# Patient Record
Sex: Female | Born: 2000 | Race: White | Hispanic: No | Marital: Single | Smoking: Never smoker
Health system: Southern US, Community
[De-identification: ages and names within clinical notes are randomized; demographics above are authoritative.]

## PROBLEM LIST (undated history)

## (undated) DIAGNOSIS — M419 Scoliosis, unspecified: Secondary | ICD-10-CM

## (undated) HISTORY — PX: NO PAST SURGERIES: SHX2092

---
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
## 2019-02-14 NOTE — L&D Delivery Note (Signed)
OB/GYN Faculty Practice Delivery Note  Cynthia Osborne is a 19 y.o. G1P0 s/p vaginal delivery at [redacted]w[redacted]d. She was admitted for PROM.   ROM: 20h 73m with clear fluid GBS Status: negative Maximum Maternal Temperature: 99.80F  Labor Progress: On arrival to L&D, pt was noted to be 1/70/-2. After conversation with pt, FB and cytotec were placed. Pt then progressed to complete and delivered without complication.  Delivery Date/Time: [phone] on 12/20/19 Delivery: Called to room and patient was complete and pushing. Head delivered ROA. No nuchal cord present. Shoulder and body delivered in usual fashion. Infant with spontaneous cry, placed on mother's abdomen, dried and stimulated. Cord clamped x 2 after 1-minute delay, and cut by FOB under my direct supervision. Cord blood drawn. Placenta delivered spontaneously with gentle cord traction. Fundus firm with massage and Pitocin. Labia, perineum, vagina, and cervix were inspected, notable for 2nd degree perineal laceration. Liletta IUD was placed and then perineal laceration was repaired as noted below.  Placenta: 3-vessel cord, intact, sent to L&D Complications: none Lacerations: 2nd degree perineal laceration repaired in standard fashion with use of 3-0 vicryl EBL: 502 ml Analgesia: epidural  Infant: female  APGARs 9 & 9  3691g  Post-Placental IUD Insertion Procedure Note Patient identified, informed consent signed prior to delivery, signed copy in chart, time out was performed.    Vaginal, labial and perineal areas thoroughly inspected for lacerations. Second degree laceration identified - not hemostatic, not repaired prior to insertion of Liletta IUD.   IUD grasped between sterile gloved fingers. Sterile lubrication applied to sterile gloved hand for ease of insertion. Fundus identified through abdominal wall using non-insertion hand. IUD inserted to fundus with bimanual technique. IUD carefully released at the fundus and insertion hand gently  removed from vagina.   Strings trimmed to the level of the introitus. Patient tolerated procedure well.  Patient given post procedure instructions and IUD care card with expiration date.  Patient is asked to keep IUD strings tucked in her vagina until her postpartum follow up visit in 4-6 weeks. Patient advised to abstain from sexual intercourse and pulling on strings before her follow-up visit. Patient verbalized an understanding of the plan of care and agrees.   Sheila Oats, MD OB Fellow, Faculty Practice 12/20/2019 10:31 AM

## 2019-05-12 LAB — HEPATITIS C ANTIBODY: HCV Ab: NEGATIVE

## 2019-05-12 LAB — OB RESULTS CONSOLE RUBELLA ANTIBODY, IGM: Rubella: IMMUNE

## 2019-05-12 LAB — OB RESULTS CONSOLE HEPATITIS B SURFACE ANTIGEN: Hepatitis B Surface Ag: NEGATIVE

## 2019-06-16 DIAGNOSIS — M5432 Sciatica, left side: Secondary | ICD-10-CM | POA: Insufficient documentation

## 2019-12-01 LAB — OB RESULTS CONSOLE RPR: RPR: NONREACTIVE

## 2019-12-01 LAB — OB RESULTS CONSOLE HIV ANTIBODY (ROUTINE TESTING): HIV: NONREACTIVE

## 2019-12-10 ENCOUNTER — Other Ambulatory Visit: Payer: Self-pay

## 2019-12-10 ENCOUNTER — Inpatient Hospital Stay (HOSPITAL_COMMUNITY)
Admission: AD | Admit: 2019-12-10 | Discharge: 2019-12-11 | Disposition: A | Discharge status: Home / Self Care | Payer: Medicaid Other | Source: Home / Self Care | Attending: Obstetrics & Gynecology | Admitting: Obstetrics & Gynecology

## 2019-12-10 ENCOUNTER — Encounter (HOSPITAL_COMMUNITY): Payer: Self-pay | Admitting: Obstetrics & Gynecology

## 2019-12-10 ENCOUNTER — Inpatient Hospital Stay (HOSPITAL_COMMUNITY)
Admission: AD | Admit: 2019-12-10 | Discharge: 2019-12-10 | Disposition: A | Discharge status: Home / Self Care | Payer: Medicaid Other | Attending: Obstetrics & Gynecology | Admitting: Obstetrics & Gynecology

## 2019-12-10 DIAGNOSIS — O479 False labor, unspecified: Secondary | ICD-10-CM

## 2019-12-10 DIAGNOSIS — O471 False labor at or after 37 completed weeks of gestation: Secondary | ICD-10-CM | POA: Insufficient documentation

## 2019-12-10 DIAGNOSIS — Z3A37 37 weeks gestation of pregnancy: Secondary | ICD-10-CM

## 2019-12-10 HISTORY — DX: Scoliosis, unspecified: M41.9

## 2019-12-10 NOTE — Discharge Instructions (Signed)
Braxton Hicks Contractions °Contractions of the uterus can occur throughout pregnancy, but they are not always a sign that you are in labor. You may have practice contractions called Braxton Hicks contractions. These false labor contractions are sometimes confused with true labor. °What are Braxton Hicks contractions? °Braxton Hicks contractions are tightening movements that occur in the muscles of the uterus before labor. Unlike true labor contractions, these contractions do not result in opening (dilation) and thinning of the cervix. Toward the end of pregnancy (32-34 weeks), Braxton Hicks contractions can happen more often and may become stronger. These contractions are sometimes difficult to tell apart from true labor because they can be very uncomfortable. You should not feel embarrassed if you go to the hospital with false labor. °Sometimes, the only way to tell if you are in true labor is for your health care provider to look for changes in the cervix. The health care provider will do a physical exam and may monitor your contractions. If you are not in true labor, the exam should show that your cervix is not dilating and your water has not broken. °If there are no other health problems associated with your pregnancy, it is completely safe for you to be sent home with false labor. You may continue to have Braxton Hicks contractions until you go into true labor. °How to tell the difference between true labor and false labor °True labor °· Contractions last 30-70 seconds. °· Contractions become very regular. °· Discomfort is usually felt in the top of the uterus, and it spreads to the lower abdomen and low back. °· Contractions do not go away with walking. °· Contractions usually become more intense and increase in frequency. °· The cervix dilates and gets thinner. °False labor °· Contractions are usually shorter and not as strong as true labor contractions. °· Contractions are usually irregular. °· Contractions  are often felt in the front of the lower abdomen and in the groin. °· Contractions may go away when you walk around or change positions while lying down. °· Contractions get weaker and are shorter-lasting as time goes on. °· The cervix usually does not dilate or become thin. °Follow these instructions at home: ° °· Take over-the-counter and prescription medicines only as told by your health care provider. °· Keep up with your usual exercises and follow other instructions from your health care provider. °· Eat and drink lightly if you think you are going into labor. °· If Braxton Hicks contractions are making you uncomfortable: °? Change your position from lying down or resting to walking, or change from walking to resting. °? Sit and rest in a tub of warm water. °? Drink enough fluid to keep your urine pale yellow. Dehydration may cause these contractions. °? Do slow and deep breathing several times an hour. °· Keep all follow-up prenatal visits as told by your health care provider. This is important. °Contact a health care provider if: °· You have a fever. °· You have continuous pain in your abdomen. °Get help right away if: °· Your contractions become stronger, more regular, and closer together. °· You have fluid leaking or gushing from your vagina. °· You pass blood-tinged mucus (bloody show). °· You have bleeding from your vagina. °· You have low back pain that you never had before. °· You feel your baby’s head pushing down and causing pelvic pressure. °· Your baby is not moving inside you as much as it used to. °Summary °· Contractions that occur before labor are   called Braxton Hicks contractions, false labor, or practice contractions. °· Braxton Hicks contractions are usually shorter, weaker, farther apart, and less regular than true labor contractions. True labor contractions usually become progressively stronger and regular, and they become more frequent. °· Manage discomfort from Braxton Hicks contractions  by changing position, resting in a warm bath, drinking plenty of water, or practicing deep breathing. °This information is not intended to replace advice given to you by your health care provider. Make sure you discuss any questions you have with your health care provider. °Document Revised: 01/12/2017 Document Reviewed: 06/15/2016 °Elsevier Patient Education © 2020 [employer] ° °

## 2019-12-10 NOTE — MAU Note (Signed)
Ctx started about 6 am. 4-5 min aprt now. Denies any vag bleeding or leaking at this time. Good fetal movement felt. Goes to [facility] for prenatal care  But will prefer to deliver at [facility]. Fingertip in office this week.

## 2019-12-10 NOTE — MAU Provider Note (Addendum)
S: Ms. Cynthia Osborne is a 19 y.o. G1P0 at [redacted]w[redacted]d  who presents to MAU today for labor evaluation.     Cervical exam by RN:  Dilation: 1 Effacement (%): 50, 60 Cervical Position: Middle Station: -3 Presentation: Vertex Exam by:: n druebbisch rn  Fetal Monitoring: Baseline: 140 Variability: mod Accelerations: yes Decelerations: no Contractions: irregular  MDM Discussed patient with RN. NST reviewed and reactive  A: SIUP at [redacted]w[redacted]d  False labor  P: Discharge home Labor precautions and kick counts included in AVS Patient to follow-up with [facility] as scheduled  Patient may return to MAU as needed or when in labor   Jesusita Oka, MD 12/10/2019 10:22 AM

## 2019-12-10 NOTE — Progress Notes (Signed)
Cynthia Osborne CNM aware of pt's admission and status. Will reck in an hour. Pt aware and agrees with POC

## 2019-12-10 NOTE — Progress Notes (Signed)
OK per Artelia Laroche CNM to d/c EFM per pt request so she can ambulate and use ball.

## 2019-12-10 NOTE — MAU Note (Addendum)
Ctxs since 0400. Was seen earlier today in MAU with ctxs. 1cm at that ck. Ctxs closer and stronger and feeling pelvic pressure. Denies LOF or VB. Pt gets PNC in August but plans to deliver here.

## 2019-12-11 DIAGNOSIS — O471 False labor at or after 37 completed weeks of gestation: Secondary | ICD-10-CM | POA: Diagnosis not present

## 2019-12-11 MED ORDER — ACETAMINOPHEN 500 MG PO TABS
1000.0000 mg | ORAL_TABLET | Freq: Once | ORAL | Status: AC
  Administered 2019-12-11: 1000 mg via ORAL
  Filled 2019-12-11: qty 2

## 2019-12-11 NOTE — Progress Notes (Addendum)
WRitten and verbal d/c instructions given and understanding voiced. Discussed with pt and her mother that pt could try warm baths, position changes, Benadryl and Tylenol may help her rest. Do not take anymore Tylenol tonight since she had it in MAU but could take Benadryl when she gets home.

## 2019-12-11 NOTE — Progress Notes (Signed)
Dr Larita Fife called and report given and update that pt's h/a is better. Aware of sve x 3, FHR reactive, ctx pattern. PT stable for d/c home.

## 2019-12-11 NOTE — Progress Notes (Signed)
PT states she felt something bulging when she was walking and had a lot of pelvic pressure. SVE done and no change.

## 2019-12-11 NOTE — Discharge Instructions (Signed)
Braxton Hicks Contractions °Contractions of the uterus can occur throughout pregnancy, but they are not always a sign that you are in labor. You may have practice contractions called Braxton Hicks contractions. These false labor contractions are sometimes confused with true labor. °What are Braxton Hicks contractions? °Braxton Hicks contractions are tightening movements that occur in the muscles of the uterus before labor. Unlike true labor contractions, these contractions do not result in opening (dilation) and thinning of the cervix. Toward the end of pregnancy (32-34 weeks), Braxton Hicks contractions can happen more often and may become stronger. These contractions are sometimes difficult to tell apart from true labor because they can be very uncomfortable. You should not feel embarrassed if you go to the hospital with false labor. °Sometimes, the only way to tell if you are in true labor is for your health care provider to look for changes in the cervix. The health care provider will do a physical exam and may monitor your contractions. If you are not in true labor, the exam should show that your cervix is not dilating and your water has not broken. °If there are no other health problems associated with your pregnancy, it is completely safe for you to be sent home with false labor. You may continue to have Braxton Hicks contractions until you go into true labor. °How to tell the difference between true labor and false labor °True labor °· Contractions last 30-70 seconds. °· Contractions become very regular. °· Discomfort is usually felt in the top of the uterus, and it spreads to the lower abdomen and low back. °· Contractions do not go away with walking. °· Contractions usually become more intense and increase in frequency. °· The cervix dilates and gets thinner. °False labor °· Contractions are usually shorter and not as strong as true labor contractions. °· Contractions are usually irregular. °· Contractions  are often felt in the front of the lower abdomen and in the groin. °· Contractions may go away when you walk around or change positions while lying down. °· Contractions get weaker and are shorter-lasting as time goes on. °· The cervix usually does not dilate or become thin. °Follow these instructions at home: ° °· Take over-the-counter and prescription medicines only as told by your health care provider. °· Keep up with your usual exercises and follow other instructions from your health care provider. °· Eat and drink lightly if you think you are going into labor. °· If Braxton Hicks contractions are making you uncomfortable: °? Change your position from lying down or resting to walking, or change from walking to resting. °? Sit and rest in a tub of warm water. °? Drink enough fluid to keep your urine pale yellow. Dehydration may cause these contractions. °? Do slow and deep breathing several times an hour. °· Keep all follow-up prenatal visits as told by your health care provider. This is important. °Contact a health care provider if: °· You have a fever. °· You have continuous pain in your abdomen. °Get help right away if: °· Your contractions become stronger, more regular, and closer together. °· You have fluid leaking or gushing from your vagina. °· You pass blood-tinged mucus (bloody show). °· You have bleeding from your vagina. °· You have low back pain that you never had before. °· You feel your baby’s head pushing down and causing pelvic pressure. °· Your baby is not moving inside you as much as it used to. °Summary °· Contractions that occur before labor are   called Braxton Hicks contractions, false labor, or practice contractions. °· Braxton Hicks contractions are usually shorter, weaker, farther apart, and less regular than true labor contractions. True labor contractions usually become progressively stronger and regular, and they become more frequent. °· Manage discomfort from Braxton Hicks contractions  by changing position, resting in a warm bath, drinking plenty of water, or practicing deep breathing. °This information is not intended to replace advice given to you by your health care provider. Make sure you discuss any questions you have with your health care provider. °Document Revised: 01/12/2017 Document Reviewed: 06/15/2016 °Elsevier Patient Education © 2020 [employer] ° °

## 2019-12-11 NOTE — Progress Notes (Signed)
Dr Larita Fife called in BS. Finishing a delivery and will call to MAU. Pt requested Tylenol for h/a. Verbal order given. Dr Larita Fife will call MAU when finished with delivery.

## 2019-12-19 ENCOUNTER — Other Ambulatory Visit: Payer: Self-pay

## 2019-12-19 ENCOUNTER — Encounter (HOSPITAL_COMMUNITY): Payer: Self-pay | Admitting: Obstetrics and Gynecology

## 2019-12-19 ENCOUNTER — Inpatient Hospital Stay (HOSPITAL_COMMUNITY)
Admission: AD | Admit: 2019-12-19 | Discharge: 2019-12-22 | DRG: 807 | Disposition: A | Discharge status: Home / Self Care | Payer: Medicaid Other | Attending: Obstetrics and Gynecology | Admitting: Obstetrics and Gynecology

## 2019-12-19 DIAGNOSIS — G8929 Other chronic pain: Secondary | ICD-10-CM

## 2019-12-19 DIAGNOSIS — O99893 Other specified diseases and conditions complicating puerperium: Secondary | ICD-10-CM | POA: Diagnosis not present

## 2019-12-19 DIAGNOSIS — O4292 Full-term premature rupture of membranes, unspecified as to length of time between rupture and onset of labor: Secondary | ICD-10-CM | POA: Diagnosis present

## 2019-12-19 DIAGNOSIS — O4202 Full-term premature rupture of membranes, onset of labor within 24 hours of rupture: Secondary | ICD-10-CM | POA: Diagnosis not present

## 2019-12-19 DIAGNOSIS — M4135 Thoracogenic scoliosis, thoracolumbar region: Secondary | ICD-10-CM | POA: Diagnosis not present

## 2019-12-19 DIAGNOSIS — Z3490 Encounter for supervision of normal pregnancy, unspecified, unspecified trimester: Secondary | ICD-10-CM

## 2019-12-19 DIAGNOSIS — Z3A38 38 weeks gestation of pregnancy: Secondary | ICD-10-CM

## 2019-12-19 DIAGNOSIS — E669 Obesity, unspecified: Secondary | ICD-10-CM | POA: Diagnosis present

## 2019-12-19 DIAGNOSIS — Z30014 Encounter for initial prescription of intrauterine contraceptive device: Secondary | ICD-10-CM | POA: Diagnosis not present

## 2019-12-19 DIAGNOSIS — Z30430 Encounter for insertion of intrauterine contraceptive device: Secondary | ICD-10-CM | POA: Diagnosis not present

## 2019-12-19 DIAGNOSIS — O99892 Other specified diseases and conditions complicating childbirth: Secondary | ICD-10-CM | POA: Diagnosis present

## 2019-12-19 DIAGNOSIS — R519 Headache, unspecified: Secondary | ICD-10-CM | POA: Diagnosis not present

## 2019-12-19 DIAGNOSIS — O99214 Obesity complicating childbirth: Secondary | ICD-10-CM | POA: Diagnosis present

## 2019-12-19 DIAGNOSIS — M419 Scoliosis, unspecified: Secondary | ICD-10-CM | POA: Diagnosis present

## 2019-12-19 DIAGNOSIS — Z20822 Contact with and (suspected) exposure to covid-19: Secondary | ICD-10-CM | POA: Diagnosis present

## 2019-12-19 DIAGNOSIS — Z975 Presence of (intrauterine) contraceptive device: Secondary | ICD-10-CM | POA: Diagnosis not present

## 2019-12-19 DIAGNOSIS — O26893 Other specified pregnancy related conditions, third trimester: Secondary | ICD-10-CM | POA: Diagnosis present

## 2019-12-19 LAB — CBC
HCT: 33.5 % — ABNORMAL LOW (ref 36.0–46.0)
Hemoglobin: 10.9 g/dL — ABNORMAL LOW (ref 12.0–15.0)
MCH: 28.7 pg (ref 26.0–34.0)
MCHC: 32.5 g/dL (ref 30.0–36.0)
MCV: 88.2 fL (ref 80.0–100.0)
Platelets: 175 10*3/uL (ref 150–400)
RBC: 3.8 MIL/uL — ABNORMAL LOW (ref 3.87–5.11)
RDW: 13.4 % (ref 11.5–15.5)
WBC: 9.8 10*3/uL (ref 4.0–10.5)
nRBC: 0 % (ref 0.0–0.2)

## 2019-12-19 LAB — RESPIRATORY PANEL BY RT PCR (FLU A&B, COVID)
Influenza A by PCR: NEGATIVE
Influenza B by PCR: NEGATIVE
SARS Coronavirus 2 by RT PCR: NEGATIVE

## 2019-12-19 LAB — TYPE AND SCREEN
ABO/RH(D): B POS
Antibody Screen: NEGATIVE

## 2019-12-19 LAB — POCT FERN TEST: POCT Fern Test: POSITIVE — AB

## 2019-12-19 MED ORDER — LACTATED RINGERS IV SOLN
500.0000 mL | INTRAVENOUS | Status: DC | PRN
  Administered 2019-12-20: 500 mL via INTRAVENOUS

## 2019-12-19 MED ORDER — FENTANYL CITRATE (PF) 100 MCG/2ML IJ SOLN
50.0000 ug | INTRAMUSCULAR | Status: DC | PRN
  Administered 2019-12-19 – 2019-12-20 (×3): 100 ug via INTRAVENOUS
  Filled 2019-12-19 (×3): qty 2

## 2019-12-19 MED ORDER — ACETAMINOPHEN 325 MG PO TABS: 650.0000 mg | ORAL_TABLET | ORAL | Status: DC | PRN

## 2019-12-19 MED ORDER — LACTATED RINGERS IV SOLN: INTRAVENOUS | Status: DC

## 2019-12-19 MED ORDER — FLEET ENEMA 7-19 GM/118ML RE ENEM: 1.0000 | ENEMA | RECTAL | Status: DC | PRN

## 2019-12-19 MED ORDER — OXYTOCIN BOLUS FROM INFUSION
333.0000 mL | Freq: Once | INTRAVENOUS | Status: AC
  Administered 2019-12-20: 333 mL via INTRAVENOUS

## 2019-12-19 MED ORDER — OXYTOCIN-SODIUM CHLORIDE 30-0.9 UT/500ML-% IV SOLN
2.5000 [IU]/h | INTRAVENOUS | Status: DC
  Administered 2019-12-20: 2.5 [IU]/h via INTRAVENOUS
  Filled 2019-12-19: qty 500

## 2019-12-19 MED ORDER — OXYCODONE-ACETAMINOPHEN 5-325 MG PO TABS: 2.0000 | ORAL_TABLET | ORAL | Status: DC | PRN

## 2019-12-19 MED ORDER — LIDOCAINE HCL (PF) 1 % IJ SOLN: 30.0000 mL | INTRAMUSCULAR | Status: DC | PRN

## 2019-12-19 MED ORDER — SOD CITRATE-CITRIC ACID 500-334 MG/5ML PO SOLN: 30.0000 mL | ORAL | Status: DC | PRN

## 2019-12-19 MED ORDER — ONDANSETRON HCL 4 MG/2ML IJ SOLN
4.0000 mg | Freq: Four times a day (QID) | INTRAMUSCULAR | Status: DC | PRN
  Administered 2019-12-19 – 2019-12-20 (×2): 4 mg via INTRAVENOUS
  Filled 2019-12-19 (×2): qty 2

## 2019-12-19 MED ORDER — MISOPROSTOL 50MCG HALF TABLET
50.0000 ug | ORAL_TABLET | Freq: Once | ORAL | Status: AC
  Administered 2019-12-19: 50 ug via ORAL
  Filled 2019-12-19: qty 1

## 2019-12-19 MED ORDER — OXYCODONE-ACETAMINOPHEN 5-325 MG PO TABS: 1.0000 | ORAL_TABLET | ORAL | Status: DC | PRN

## 2019-12-19 NOTE — H&P (Addendum)
OBSTETRIC ADMISSION HISTORY AND PHYSICAL  Cynthia Osborne is a 19 y.o. female G1P0 with IUP at [redacted]w[redacted]d by LMP presenting for ROM and contractions. She reports +FMs, No LOF, no VB, no blurry vision, headaches or peripheral edema, and RUQ pain.  She plans on breast feeding. She request IUD, mirena for birth control. She received her prenatal care at  [facility] in [place]  but plans to deliver here at [facility] because of recent move to [place].   Dating: By LMP --->  Estimated Date of Delivery: 12/27/19  Sono:  @ 20+w, female fetus, normal anatomy, anterior placenta  @36 +wks, CWD, normal anatomy, vertex presentation, 7 lb 15 oz, 90th% EFW  Prenatal History/Complications:  Scoliosis of thoracolumbar spine, unspecified scoliosis type   Chronic nonintractable headache N/V prior to [redacted] weeks gestation Sciatic pain, left-sided  Past Medical History: Past Medical History:  Diagnosis Date   Scoliosis     Past Surgical History: Past Surgical History:  Procedure Laterality Date   NO PAST SURGERIES      Obstetrical History: OB History     Gravida  1   Para      Term      Preterm      AB      Living         SAB      TAB      Ectopic      Multiple      Live Births              Social History Social History   Socioeconomic History   Marital status: Single    Spouse name: Not on file   Number of children: Not on file   Years of education: Not on file   Highest education level: Not on file  Occupational History   Not on file  Tobacco Use   Smoking status: Never Smoker   Smokeless tobacco: Never Used  Substance and Sexual Activity   Alcohol use: Never   Drug use: Never   Sexual activity: Not on file  Other Topics Concern   Not on file  Social History Narrative   Not on file   Social Determinants of Health   Financial Resource Strain:    Difficulty of Paying Living Expenses: Not on file  Food Insecurity:    Worried About Running Out  of Food in the Last Year: Not on file   Ran Out of Food in the Last Year: Not on file  Transportation Needs:    Lack of Transportation (Medical): Not on file   Lack of Transportation (Non-Medical): Not on file  Physical Activity:    Days of Exercise per Week: Not on file   Minutes of Exercise per Session: Not on file  Stress:    Feeling of Stress : Not on file  Social Connections:    Frequency of Communication with Friends and Family: Not on file   Frequency of Social Gatherings with Friends and Family: Not on file   Attends Religious Services: Not on file   Active Member of Clubs or Organizations: Not on file   Attends Meetings: Not on file   Marital Status: Not on file    Family History: History reviewed. No pertinent family history.  Allergies: No Known Allergies  Medications Prior to Admission  Medication Sig Dispense Refill Last Dose   cyclobenzaprine (FLEXERIL) 5 MG tablet Take 5 mg by mouth 3 (three) times daily as needed for muscle spasms.  Past Week at Unknown time   Prenatal Vit-Fe Fumarate-FA (PRENATAL MULTIVITAMIN) TABS tablet Take 1 tablet by mouth daily at 12 noon.   12/18/2019 at Unknown time     Review of Systems   All systems reviewed and negative except as stated in HPI  Blood pressure 117/76, pulse 89, temperature 98.4 F (36.9 C), temperature source Oral, resp. rate 18, height 5\' 2"  (1.575 m), weight 78.5 kg, SpO2 100 %. General appearance: alert, cooperative and appears stated age Lungs: clear to auscultation bilaterally Heart: regular rate and rhythm Abdomen: gravid Pelvic: Pelvic exam: normal external genitalia, vulva, vagina, cervix Extremities: Homans sign is negative, no sign of DVT Presentation: cephalic Fetal monitoringBaseline: 135 bpm, Variability: Good {> 6 bpm) and Accelerations: Reactive Uterine activity: Infrequent, quiet Dilation: 2 Effacement (%): 60 Station: -2 Exam by:: MD  Prenatal labs ([facility] records in  CareEverywhere) ABO, Rh:  B, + Antibody:  negative Rubella:  Immune RPR:   negative HBsAg:   negative HIV:   negative GC/Chlamydia and Trich negative @ 36.2w GBS:   negative 1 hr Glucola passed, glucose 125 Genetic screening: AFP negative, NIPS negative Anatomy [phone] Normal  Prenatal Transfer Tool  Maternal Diabetes: No Genetic Screening: Normal Maternal Ultrasounds/Referrals: Normal Fetal Ultrasounds or other Referrals:  None Maternal Substance Abuse:  No Significant Maternal Medications:  Meds include: Protonix, Flexeril Significant Maternal Lab Results: Group B Strep negative  Results for orders placed or performed during the hospital encounter of 12/19/19 (from the past 24 hour(s))  Fern Test   Collection Time: 12/19/19  6:37 PM  Result Value Ref Range   POCT Fern Test Positive = ruptured amniotic membanes (A)    Patient Active Problem List   Diagnosis Date Noted   Pregnancy 12/19/2019   Assessment/Plan:  Cynthia Osborne is a 19 y.o. G1P0 at [redacted]w[redacted]d here for SOL s/p PROM at 0900 on 11/5. Fern+ on presentation to MAU.  #Labor s/p PPROM: will plan to augment as clinically indicated. #Pain: IV pain medication and epidural when patient desires #FWB: CAT I #ID: GBS negative #MOF: Breast #MOC: IUD (Mirena)--pt undecided regarding post-placental vs interval placement #Circ: N/a #H/o Thoracolumbar Scoliosis: no history of back surgeries. Anesthesia aware given pt considering possible epidural.  13/5, DO  12/19/2019, 10:06 PM  Attestation of Supervision of Student:  I confirm that I have verified the information documented in the  resident's  note and that I have also personally reperformed the history, physical exam and all medical decision making activities.  I have verified that all services and findings are accurately documented in this student's note; and I agree with management and plan as outlined in the documentation. I have also made any necessary  editorial changes.  13/06/2019, MD [facility], [facility] 12/20/2019 12:34 AM

## 2019-12-19 NOTE — MAU Note (Signed)
Patient reports SROM at 0900-clear fluid. Some pink discharge but no frank bleeding.  CTX every 2-3 minutes.  Endorses + FM.

## 2019-12-20 ENCOUNTER — Inpatient Hospital Stay (HOSPITAL_COMMUNITY): Payer: Medicaid Other | Admitting: Anesthesiology

## 2019-12-20 ENCOUNTER — Encounter (HOSPITAL_COMMUNITY): Payer: Self-pay | Admitting: Obstetrics and Gynecology

## 2019-12-20 DIAGNOSIS — M419 Scoliosis, unspecified: Secondary | ICD-10-CM

## 2019-12-20 DIAGNOSIS — O4202 Full-term premature rupture of membranes, onset of labor within 24 hours of rupture: Secondary | ICD-10-CM

## 2019-12-20 DIAGNOSIS — R519 Headache, unspecified: Secondary | ICD-10-CM

## 2019-12-20 DIAGNOSIS — G8929 Other chronic pain: Secondary | ICD-10-CM

## 2019-12-20 DIAGNOSIS — Z30014 Encounter for initial prescription of intrauterine contraceptive device: Secondary | ICD-10-CM

## 2019-12-20 DIAGNOSIS — Z3A38 38 weeks gestation of pregnancy: Secondary | ICD-10-CM

## 2019-12-20 LAB — RPR: RPR Ser Ql: NONREACTIVE

## 2019-12-20 MED ORDER — EPHEDRINE 5 MG/ML INJ: 10.0000 mg | INTRAVENOUS | Status: DC | PRN

## 2019-12-20 MED ORDER — IBUPROFEN 600 MG PO TABS
600.0000 mg | ORAL_TABLET | Freq: Four times a day (QID) | ORAL | Status: DC
  Administered 2019-12-20 – 2019-12-22 (×7): 600 mg via ORAL
  Filled 2019-12-20 (×8): qty 1

## 2019-12-20 MED ORDER — DIPHENHYDRAMINE HCL 50 MG/ML IJ SOLN: 12.5000 mg | INTRAMUSCULAR | Status: DC | PRN

## 2019-12-20 MED ORDER — FENTANYL-BUPIVACAINE-NACL 0.5-0.125-0.9 MG/250ML-% EP SOLN
EPIDURAL | Status: AC
  Filled 2019-12-20: qty 250

## 2019-12-20 MED ORDER — BENZOCAINE-MENTHOL 20-0.5 % EX AERO
1.0000 "application " | INHALATION_SPRAY | CUTANEOUS | Status: DC | PRN
  Administered 2019-12-20: 1 via TOPICAL
  Filled 2019-12-20: qty 56

## 2019-12-20 MED ORDER — SIMETHICONE 80 MG PO CHEW: 80.0000 mg | CHEWABLE_TABLET | ORAL | Status: DC | PRN

## 2019-12-20 MED ORDER — DIPHENHYDRAMINE HCL 25 MG PO CAPS: 25.0000 mg | ORAL_CAPSULE | Freq: Four times a day (QID) | ORAL | Status: DC | PRN

## 2019-12-20 MED ORDER — PRENATAL MULTIVITAMIN CH
1.0000 | ORAL_TABLET | Freq: Every day | ORAL | Status: DC
  Administered 2019-12-20 – 2019-12-21 (×2): 1 via ORAL
  Filled 2019-12-20 (×2): qty 1

## 2019-12-20 MED ORDER — ONDANSETRON HCL 4 MG PO TABS: 4.0000 mg | ORAL_TABLET | ORAL | Status: DC | PRN

## 2019-12-20 MED ORDER — SENNOSIDES-DOCUSATE SODIUM 8.6-50 MG PO TABS
2.0000 | ORAL_TABLET | ORAL | Status: DC
  Administered 2019-12-21: 2 via ORAL
  Filled 2019-12-20 (×2): qty 2

## 2019-12-20 MED ORDER — LIDOCAINE-EPINEPHRINE (PF) 2 %-1:200000 IJ SOLN
INTRAMUSCULAR | Status: DC | PRN
  Administered 2019-12-20 (×2): 5 mL via EPIDURAL

## 2019-12-20 MED ORDER — SODIUM CHLORIDE (PF) 0.9 % IJ SOLN
INTRAMUSCULAR | Status: DC | PRN
  Administered 2019-12-20: 12 mL/h via EPIDURAL

## 2019-12-20 MED ORDER — DIBUCAINE (PERIANAL) 1 % EX OINT: 1.0000 "application " | TOPICAL_OINTMENT | CUTANEOUS | Status: DC | PRN

## 2019-12-20 MED ORDER — WITCH HAZEL-GLYCERIN EX PADS
1.0000 "application " | MEDICATED_PAD | CUTANEOUS | Status: DC | PRN
  Administered 2019-12-20: 1 via TOPICAL

## 2019-12-20 MED ORDER — LIDOCAINE HCL (PF) 1 % IJ SOLN
INTRAMUSCULAR | Status: DC | PRN
  Administered 2019-12-20 (×2): 4 mL via EPIDURAL

## 2019-12-20 MED ORDER — COCONUT OIL OIL
1.0000 "application " | TOPICAL_OIL | Status: DC | PRN
  Administered 2019-12-22: 1 via TOPICAL

## 2019-12-20 MED ORDER — PHENYLEPHRINE 40 MCG/ML (10ML) SYRINGE FOR IV PUSH (FOR BLOOD PRESSURE SUPPORT)
80.0000 ug | PREFILLED_SYRINGE | INTRAVENOUS | Status: DC | PRN
  Administered 2019-12-20 (×2): 80 ug via INTRAVENOUS

## 2019-12-20 MED ORDER — ACETAMINOPHEN 325 MG PO TABS
650.0000 mg | ORAL_TABLET | ORAL | Status: DC | PRN
  Administered 2019-12-20 – 2019-12-22 (×4): 650 mg via ORAL
  Filled 2019-12-20 (×4): qty 2

## 2019-12-20 MED ORDER — PHENYLEPHRINE 40 MCG/ML (10ML) SYRINGE FOR IV PUSH (FOR BLOOD PRESSURE SUPPORT)
80.0000 ug | PREFILLED_SYRINGE | INTRAVENOUS | Status: DC | PRN
  Filled 2019-12-20: qty 10

## 2019-12-20 MED ORDER — FENTANYL-BUPIVACAINE-NACL 0.5-0.125-0.9 MG/250ML-% EP SOLN: 12.0000 mL/h | EPIDURAL | Status: DC | PRN

## 2019-12-20 MED ORDER — TRANEXAMIC ACID-NACL 1000-0.7 MG/100ML-% IV SOLN
1000.0000 mg | INTRAVENOUS | Status: AC
  Administered 2019-12-20: 1000 mg via INTRAVENOUS

## 2019-12-20 MED ORDER — LACTATED RINGERS IV SOLN
500.0000 mL | Freq: Once | INTRAVENOUS | Status: AC
  Administered 2019-12-20: 500 mL via INTRAVENOUS

## 2019-12-20 MED ORDER — TETANUS-DIPHTH-ACELL PERTUSSIS 5-2.5-18.5 LF-MCG/0.5 IM SUSY: 0.5000 mL | PREFILLED_SYRINGE | Freq: Once | INTRAMUSCULAR | Status: DC

## 2019-12-20 MED ORDER — TRANEXAMIC ACID-NACL 1000-0.7 MG/100ML-% IV SOLN
INTRAVENOUS | Status: AC
  Filled 2019-12-20: qty 100

## 2019-12-20 MED ORDER — MISOPROSTOL 200 MCG PO TABS
1000.0000 ug | ORAL_TABLET | Freq: Once | ORAL | Status: AC
  Administered 2019-12-20: 1000 ug via RECTAL

## 2019-12-20 MED ORDER — LEVONORGESTREL 19.5 MCG/DAY IU IUD
INTRAUTERINE_SYSTEM | Freq: Once | INTRAUTERINE | Status: AC
  Administered 2019-12-20: 06:00:00 1 via INTRAUTERINE
  Filled 2019-12-20: qty 1

## 2019-12-20 MED ORDER — ONDANSETRON HCL 4 MG/2ML IJ SOLN: 4.0000 mg | INTRAMUSCULAR | Status: DC | PRN

## 2019-12-20 MED ORDER — MISOPROSTOL 200 MCG PO TABS
ORAL_TABLET | ORAL | Status: AC
  Filled 2019-12-20: qty 5

## 2019-12-20 NOTE — Anesthesia Preprocedure Evaluation (Signed)
Anesthesia Evaluation  Patient identified by MRN, date of birth, ID band Patient awake    Reviewed: Allergy & Precautions, Patient's Chart, lab work & pertinent test results  Airway Mallampati: II  TM Distance: >3 FB Neck ROM: Full    Dental no notable dental hx. (+) Teeth Intact   Pulmonary neg pulmonary ROS,    Pulmonary exam normal breath sounds clear to auscultation       Cardiovascular negative cardio ROS Normal cardiovascular exam Rhythm:Regular Rate:Normal     Neuro/Psych negative neurological ROS  negative psych ROS   GI/Hepatic Neg liver ROS, GERD  ,  Endo/Other  Obesity  Renal/GU negative Renal ROS  negative genitourinary   Musculoskeletal Scoliosis   Abdominal (+) + obese,   Peds  Hematology  (+) anemia ,   Anesthesia Other Findings   Reproductive/Obstetrics (+) Pregnancy                             Anesthesia Physical Anesthesia Plan  ASA: II  Anesthesia Plan: Epidural   Post-op Pain Management:    Induction:   PONV Risk Score and Plan:   Airway Management Planned: Natural Airway  Additional Equipment:   Intra-op Plan:   Post-operative Plan:   Informed Consent: I have reviewed the patients History and Physical, chart, labs and discussed the procedure including the risks, benefits and alternatives for the proposed anesthesia with the patient or authorized representative who has indicated his/her understanding and acceptance.       Plan Discussed with: Anesthesiologist  Anesthesia Plan Comments:         Anesthesia Quick Evaluation

## 2019-12-20 NOTE — Progress Notes (Addendum)
LABOR PROGRESS NOTE  Zakyia Gagan is a 19 y.o. G1P0 at [redacted]w[redacted]d admitted for ROM/SOL.   Subjective: Feeling pelvic pressure.   Objective: BP (!) 126/53   Pulse 96   Temp 98.6 F (37 C) (Oral)   Resp 16   Ht 5\' 2"  (1.575 m)   Wt 78.5 kg   SpO2 100%   BMI 31.64 kg/m  or  Vitals:   12/20/19 0241 12/20/19 0245 12/20/19 0250 12/20/19 0254  BP:   128/63 (!) 126/53  Pulse:   97 96  Resp:      Temp:  98.6 F (37 C)    TempSrc:  Oral    SpO2: 99%  100% 100%  Weight:      Height:       Dilation: 10 Dilation Complete Date: 12/20/19 Dilation Complete Time: 0250 Effacement (%): 100 Cervical Position: Middle Station: Plus 1 Presentation: Vertex Exam by:: V. Mensah, RN FHT: baseline rate 130 bpm, moderate varibility, 15x15 acel, no decels Toco: every 2 mins  Labs: Lab Results  Component Value Date   WBC 9.8 12/19/2019   HGB 10.9 (L) 12/19/2019   HCT 33.5 (L) 12/19/2019   MCV 88.2 12/19/2019   PLT 175 12/19/2019    Patient Active Problem List   Diagnosis Date Noted  . Pregnancy 12/19/2019    Assessment / Plan: 19 y.o. G1P0 at [redacted]w[redacted]d here for ROM/SOL  Hypotension 2/2 epidural placement -Phyenylephrine x2 (10 mins apart) -Will hold off on pushing at this time until blood pressure is stable.   Labor: s/p FB, cytotecx1 Fetal Wellbeing:  Cat I  Pain Control:  Epidural Anticipated MOD: Vaginal  Simone Autry-Lott, DO 12/20/2019, 3:03 AM PGY-2, [facility]

## 2019-12-20 NOTE — Lactation Note (Signed)
This note was copied from a baby's chart. Lactation Consultation Note  Patient Name: Cynthia Osborne RUEAV'W Date: 12/20/2019 Reason for consult: Initial assessment  Mother is a P1, infant is 7 hours old . Mother was given [facility] brochure and basic teaching done.  Mother reports that infant is feeding well.  Reviewed hand expression with mother  She is active with WIC .Marland Kitchen  Mother to continue to cue base feed infant and feed at least 8-12 times or more in 24 hours and advised to allow for cluster feeding infant as needed.  Mother to continue to due STS. Mother is aware of available LC services at [facility], BFSG'S, OP Dept, and phone # for questions or concerns about breastfeeding.  Mother receptive to all teaching and plan of care.     Maternal Data Has patient been taught Hand Expression?: Yes Does the patient have breastfeeding experience prior to this delivery?: No  Feeding Feeding Type: Breast Fed  LATCH Score Latch: Grasps breast easily, tongue down, lips flanged, rhythmical sucking.  Audible Swallowing: Spontaneous and intermittent  Type of Nipple: Everted at rest and after stimulation  Comfort (Breast/Nipple): Soft / non-tender  Hold (Positioning): No assistance needed to correctly position infant at breast.  LATCH Score: 10  Interventions Interventions: Breast feeding basics reviewed;Assisted with latch;Skin to skin;Hand express;Breast compression;Adjust position  Lactation Tools Discussed/Used WIC Program: Yes   Consult Status Consult Status: Follow-up Date: 12/21/19 Follow-up type: In-patient    Stevan Born [facility] 12/20/2019, 1:45 PM

## 2019-12-20 NOTE — Plan of Care (Signed)
L&D careplan completed 

## 2019-12-20 NOTE — Lactation Note (Signed)
This note was copied from a baby's chart. Lactation Consultation Note  Patient Name: Cynthia Osborne Today's Date: 12/20/2019   Attempt to do initial visit with mother and Nursery staff and staff nurse in room orienting mother to babys needs and room. Infant is 3 hours old . LC will return at a better time for mother.      Maternal Data    Feeding Feeding Type: Breast Fed  LATCH Score Latch: Repeated attempts needed to sustain latch, nipple held in mouth throughout feeding, stimulation needed to elicit sucking reflex.  Audible Swallowing: Spontaneous and intermittent  Type of Nipple: Everted at rest and after stimulation  Comfort (Breast/Nipple): Soft / non-tender  Hold (Positioning): No assistance needed to correctly position infant at breast.  LATCH Score: 9  Interventions    Lactation Tools Discussed/Used     Consult Status      Cynthia Osborne 12/20/2019, 9:13 AM

## 2019-12-20 NOTE — Discharge Summary (Addendum)
Postpartum Discharge Summary  Date of Service updated 12/22/19     Patient Name: Cynthia Osborne DOB: Feb 25, 2000 MRN: [id number]  Date of admission: 12/19/2019 Delivery date:12/20/2019  Delivering provider: Randa Ngo  Date of discharge: 12/22/2019  Admitting diagnosis: Pregnancy [Z34.90] Intrauterine pregnancy: [redacted]w[redacted]d    Secondary diagnosis:  Principal Problem:   Vaginal delivery Active Problems:   Pregnancy   Scoliosis of thoracolumbar spine   Chronic headache  Additional problems: as noted above   Discharge diagnosis: Vaginal delivery                                        Post partum procedures: post-placental Liletta IUD Augmentation: Cytotec and IP Foley Complications: None  Hospital course: Onset of Labor With Vaginal Delivery      19y.o. yo G1P1001 at 310w0das admitted in Latent Labor s/p PROM on 12/19/2019. Patient had an uncomplicated labor course as follows:  Membrane Rupture Time/Date: 9:00 AM ,12/19/2019   Delivery Method:Vaginal, Spontaneous  Episiotomy: None  Lacerations:  2nd degree  Patient had an uncomplicated postpartum course. Post-placental Liletta IUD was placed. She is ambulating, tolerating a regular diet, passing flatus, and urinating well. Patient is discharged home in stable condition on 12/22/19.  Newborn Data: Birth date:12/20/2019  Birth time:5:47 AM  Gender:Female  Living status:Living  Apgars:9 ,9  Weight:3691 g   Magnesium Sulfate received: No BMZ received: No Rhophylac:N/A MMR:N/A T-DaP:Given prenatally Flu: N/A Transfusion:No  Physical exam  Vitals:   12/20/19 1737 12/20/19 2130 12/21/19 0409 12/21/19 1626  BP: 107/70 98/72 101/69 114/75  Pulse: 82 88 81 81  Resp: '16 16 18 18  ' Temp: 97.7 F (36.5 C) 98 F (36.7 C) 98.2 F (36.8 C) 98.2 F (36.8 C)  TempSrc: Oral Oral Oral Oral  SpO2:  100% 100%   Weight:      Height:       General: alert Lochia: appropriate Uterine Fundus: firm Incision: N/A DVT  Evaluation: No evidence of DVT seen on physical exam. No cords or calf tenderness. No significant calf/ankle edema. Labs: Lab Results  Component Value Date   WBC 9.6 12/21/2019   HGB 8.9 (L) 12/21/2019   HCT 28.2 (L) 12/21/2019   MCV 89.8 12/21/2019   PLT 174 12/21/2019   No flowsheet data found. Edinburgh Score: Edinburgh Postnatal Depression Scale Screening Tool 12/20/2019  I have been able to laugh and see the funny side of things. 0  I have looked forward with enjoyment to things. 0  I have blamed myself unnecessarily when things went wrong. 1  I have been anxious or worried for no good reason. 3  I have felt scared or panicky for no good reason. 0  Things have been getting on top of me. 1  I have been so unhappy that I have had difficulty sleeping. 0  I have felt sad or miserable. 1  I have been so unhappy that I have been crying. 1  The thought of harming myself has occurred to me. 0  Edinburgh Postnatal Depression Scale Total 7     After visit meds:  Allergies as of 12/22/2019   No Known Allergies      Medication List     TAKE these medications    acetaminophen 325 MG tablet Commonly known as: Tylenol Take 2 tablets (650 mg total) by mouth every 4 (four) hours as  needed (for pain scale < 4).   cyclobenzaprine 5 MG tablet Commonly known as: FLEXERIL Take 5 mg by mouth 3 (three) times daily as needed for muscle spasms.   ibuprofen 600 MG tablet Commonly known as: ADVIL Take 1 tablet (600 mg total) by mouth every 6 (six) hours.   prenatal multivitamin Tabs tablet Take 1 tablet by mouth daily at 12 noon.         Discharge home in stable condition Infant Feeding: Breast Infant Disposition:home with mother Discharge instruction: per After Visit Summary and Postpartum booklet. Activity: Advance as tolerated. Pelvic rest for 6 weeks.  Diet: routine diet Future Appointments:No future appointments. Follow up Visit:  Message sent to [facility] on  12/20/19.  Please schedule this patient for a In person postpartum visit in 4 weeks with the following provider: Any provider. Additional Postpartum F/U: none   Low risk pregnancy complicated by:  scoliosis, chronic headaches , transfer of care late in pregnancy from [facility] Delivery mode:  Vaginal, Spontaneous  Anticipated Birth Control:  PP IUD placed   12/22/2019 Baldo Ash, MD  CNM attestation:  I have seen and examined this patient and agree with above documentation in the resident student's note.   Cynthia Osborne is a 19 y.o. G1P1001 at 39w0dreporting s/p NSVD  Lochia is mild, pain is well controlled.   PE: Patient Vitals for the past 24 hrs:  BP Temp Temp src Pulse Resp  12/21/19 1626 114/75 98.2 F (36.8 C) Oral 81 18   Gen: calm comfortable, NAD Resp: normal effort, no distress Heart: Regular rate   ROS, labs, PMH reviewed  Assessment: S/p NSVD; doing well and ready to go home.   Plan: - Discharge home in stable condition. [facility]/09/2019 11:51 AM

## 2019-12-20 NOTE — Anesthesia Procedure Notes (Signed)
Epidural Patient location during procedure: OB Start time: 12/20/2019 12:58 AM End time: 12/20/2019 1:05 AM  Staffing Anesthesiologist: Mal Amabile, MD Performed: anesthesiologist   Preanesthetic Checklist Completed: patient identified, IV checked, site marked, risks and benefits discussed, surgical consent, monitors and equipment checked, pre-op evaluation and timeout performed  Epidural Patient position: sitting Prep: DuraPrep and site prepped and draped Patient monitoring: continuous pulse ox and blood pressure Approach: midline Location: L3-L4 Injection technique: LOR air  Needle:  Needle type: Tuohy  Needle gauge: 17 G Needle length: 9 cm and 9 Needle insertion depth: 4 cm Catheter type: closed end flexible Catheter size: 19 Gauge Catheter at skin depth: 9 cm Test dose: negative and Other  Assessment Events: blood not aspirated, injection not painful, no injection resistance, no paresthesia and negative IV test  Additional Notes Patient identified. Risks and benefits discussed including failed block, incomplete  Pain control, post dural puncture headache, nerve damage, paralysis, blood pressure Changes, nausea, vomiting, reactions to medications-both toxic and allergic and post Partum back pain. All questions were answered. Patient expressed understanding and wished to proceed. Sterile technique was used throughout procedure. Epidural site was Dressed with sterile barrier dressing. No paresthesias, signs of intravascular injection Or signs of intrathecal spread were encountered.  Patient was more comfortable after the epidural was dosed. Please see RN's note for documentation of vital signs and FHR which are stable. Reason for block:procedure for pain

## 2019-12-20 NOTE — Discharge Instructions (Signed)

## 2019-12-21 DIAGNOSIS — Z975 Presence of (intrauterine) contraceptive device: Secondary | ICD-10-CM

## 2019-12-21 LAB — CBC
HCT: 28.2 % — ABNORMAL LOW (ref 36.0–46.0)
Hemoglobin: 8.9 g/dL — ABNORMAL LOW (ref 12.0–15.0)
MCH: 28.3 pg (ref 26.0–34.0)
MCHC: 31.6 g/dL (ref 30.0–36.0)
MCV: 89.8 fL (ref 80.0–100.0)
Platelets: 174 10*3/uL (ref 150–400)
RBC: 3.14 MIL/uL — ABNORMAL LOW (ref 3.87–5.11)
RDW: 13.9 % (ref 11.5–15.5)
WBC: 9.6 10*3/uL (ref 4.0–10.5)
nRBC: 0 % (ref 0.0–0.2)

## 2019-12-21 NOTE — Lactation Note (Addendum)
This note was copied from a baby's chart. Lactation Consultation Note  Patient Name: Cynthia Osborne KZLDJ'T Date: 12/21/2019 Reason for consult: Follow-up assessment   Mother is a P35, infant is 33 hours old and is now at 7.5 % wt loss.   Mother reports that infant is feeding well.   Mother was observed with infant latched on at the left breast. In cross cradle hold Observed infant suckling with audible swallows. Infant sustained latch for 20 mins.   Lots of teaching with parents of soothing infants. Reviewed limiting use of pacifiers. Encouraged frequent STS. Discussed cluster feeding again.   Mother to continue to cue base feed infant and feed at least 8-12 times or more in 24 hours and advised to allow for cluster feeding infant as needed.   Mother to continue to due STS. Mother is aware of available LC services at [facility], BFSG'S, OP Dept, and phone # for questions or concerns about breastfeeding.  Mother receptive to all teaching and plan of care.     Maternal Data    Feeding Feeding Type: Breast Fed  LATCH Score Latch: Grasps breast easily, tongue down, lips flanged, rhythmical sucking.  Audible Swallowing: Spontaneous and intermittent  Type of Nipple: Everted at rest and after stimulation  Comfort (Breast/Nipple): Soft / non-tender  Hold (Positioning): Assistance needed to correctly position infant at breast and maintain latch.  LATCH Score: 9  Interventions Interventions: Breast feeding basics reviewed;Assisted with latch;Skin to skin;Hand express;Breast compression;Adjust position;Support pillows;Position options  Lactation Tools Discussed/Used     Consult Status Consult Status: Follow-up Date: 12/22/19 Follow-up type: In-patient    Stevan Born [facility] 12/21/2019, 8:44 AM

## 2019-12-21 NOTE — Anesthesia Postprocedure Evaluation (Signed)
Anesthesia Post Note  Patient: Cynthia Osborne  Procedure(s) Performed: AN AD HOC LABOR EPIDURAL     Patient location during evaluation: Mother Baby Anesthesia Type: Epidural Level of consciousness: awake and alert Pain management: pain level controlled Vital Signs Assessment: post-procedure vital signs reviewed and stable Respiratory status: spontaneous breathing, nonlabored ventilation and respiratory function stable Cardiovascular status: stable Postop Assessment: no headache, no backache and epidural receding Anesthetic complications: no   No complications documented.  Last Vitals:  Vitals:   12/20/19 2130 12/21/19 0409  BP: 98/72 101/69  Pulse: 88 81  Resp: 16 18  Temp: 36.7 C 36.8 C  SpO2: 100% 100%    Last Pain:  Vitals:   12/21/19 0753  TempSrc:   PainSc: Asleep   Pain Goal: Patients Stated Pain Goal: 3 (12/20/19 1049)                 [facility]ica Records

## 2019-12-21 NOTE — Progress Notes (Addendum)
POSTPARTUM PROGRESS NOTE  Post Partum Day 1  Subjective:  Cynthia Osborne is a 19 y.o. G1P1001 s/p NSVD at [redacted]w[redacted]d.  She reports she is doing well. No acute events overnight. She denies any problems with ambulating, voiding or po intake. Denies nausea or vomiting.  Pain is well controlled.  Lochia is minimal.  Objective: Blood pressure 98/72, pulse 88, temperature 98 F (36.7 C), temperature source Oral, resp. rate 16, height 5\' 2"  (1.575 m), weight 78.5 kg, SpO2 100 %, unknown if currently breastfeeding.  Physical Exam:  General: alert, cooperative and no distress Chest: no respiratory distress Heart:regular rate, distal pulses intact Abdomen: soft, nontender,  Uterine Fundus: firm, appropriately tender DVT Evaluation: No calf swelling or tenderness Extremities: No LE edema Skin: warm, dry  Recent Labs    12/19/19 1908  HGB 10.9*  HCT 33.5*    Assessment/Plan: Cynthia Osborne is a 19 y.o. G1P1001 s/p NSVD at [redacted]w[redacted]d   PPD#1- Doing well Routine postpartum care Contraception: s/p PP Liletta Feeding: Breast Dispo: Plan for discharge home tomorrow.   LOS: 2 days   [redacted]w[redacted]d, DO 12/21/2019, 3:49 AM PGY-2, [facility]  Attestation of Supervision of Student:  I confirm that I have verified the information documented in the  resident's  note and that I have also personally reperformed the history, physical exam and all medical decision making activities.  I have verified that all services and findings are accurately documented in this student's note; and I agree with management and plan as outlined in the documentation. I have also made any necessary editorial changes.  13/08/2019, MD Center for [facility], [facility] 12/21/2019 11:40 AM

## 2019-12-22 DIAGNOSIS — M4135 Thoracogenic scoliosis, thoracolumbar region: Secondary | ICD-10-CM

## 2019-12-22 DIAGNOSIS — O99893 Other specified diseases and conditions complicating puerperium: Secondary | ICD-10-CM

## 2019-12-22 DIAGNOSIS — R519 Headache, unspecified: Secondary | ICD-10-CM

## 2019-12-22 MED ORDER — ACETAMINOPHEN 325 MG PO TABS: 650.0000 mg | ORAL_TABLET | ORAL | 0 refills | Status: AC | PRN

## 2019-12-22 MED ORDER — IBUPROFEN 600 MG PO TABS: 600.0000 mg | ORAL_TABLET | Freq: Four times a day (QID) | ORAL | 0 refills | Status: AC

## 2019-12-22 NOTE — Lactation Note (Signed)
This note was copied from a baby's chart. Lactation Consultation Note  Patient Name: Girl Ronell Boldin UYQIH'[id number] Date: 12/22/2019   P1, Baby 51 hours old.  Mother has baby latched upon entering. Mother complaining of nipple tenderness.  Discussed keeping a deep latch and applying ebm. Repositioned mother to laid back position for increased depth with lips flanged. Mother had improved comfort. Feed on demand with cues.  Goal 8+ times per day after first 24 hrs.  Place baby STS if not cueing.  Reviewed engorgement care and monitoring voids/stools.      Maternal Data    Feeding Feeding Type: Breast Fed  LATCH Score Latch: Grasps breast easily, tongue down, lips flanged, rhythmical sucking.  Audible Swallowing: Spontaneous and intermittent  Type of Nipple: Everted at rest and after stimulation  Comfort (Breast/Nipple): Filling, red/small blisters or bruises, mild/mod discomfort  Hold (Positioning): Assistance needed to correctly position infant at breast and maintain latch.  LATCH Score: 8  Interventions    Lactation Tools Discussed/Used     Consult Status      Dahlia Byes [facility] 12/22/2019, 9:04 AM

## 2019-12-23 ENCOUNTER — Inpatient Hospital Stay (HOSPITAL_COMMUNITY)
Admission: AD | Admit: 2019-12-23 | Discharge: 2019-12-23 | Disposition: A | Discharge status: Home / Self Care | Payer: Medicaid Other | Attending: Obstetrics and Gynecology | Admitting: Obstetrics and Gynecology

## 2019-12-23 ENCOUNTER — Encounter (HOSPITAL_COMMUNITY): Payer: Self-pay | Admitting: Obstetrics and Gynecology

## 2019-12-23 ENCOUNTER — Other Ambulatory Visit: Payer: Self-pay

## 2019-12-23 DIAGNOSIS — N939 Abnormal uterine and vaginal bleeding, unspecified: Secondary | ICD-10-CM | POA: Diagnosis present

## 2019-12-23 DIAGNOSIS — Z711 Person with feared health complaint in whom no diagnosis is made: Secondary | ICD-10-CM | POA: Diagnosis not present

## 2019-12-23 NOTE — MAU Note (Signed)
PT SAYS SHE DEL VAG ON SAT 11-6 AND WENT HOME ON Monday 11-8.  WENT TO B-ROOM TONIGHT -  AND SAW A PIECE OF MEAT COMING OUT OF VAG. . SHE THINKS SHE TORE HER STITCHES .    BREAST FEDING

## 2019-12-23 NOTE — MAU Provider Note (Signed)
First Provider Initiated Contact with Patient 12/23/19 0159     S Ms. Cynthia Osborne is a 19 y.o. G1P1001 patient who presents to MAU today with complaint of vaginal bleeding and went to the bathroom tonight and "saw a piece of meat coming out of vagina", unsure of whether she tore her stitches.   O BP 114/68 (BP Location: Right Arm)   Pulse 87   Temp 98.8 F (37.1 C) (Oral)   Resp 20   Ht 5\' 2"  (1.575 m)   Wt 75.2 kg   BMI 30.33 kg/m  Physical Exam Vitals and nursing note reviewed.  Cardiovascular:     Rate and Rhythm: Normal rate and regular rhythm.  Pulmonary:     Effort: Pulmonary effort is normal. No respiratory distress.     Breath sounds: Normal breath sounds. No wheezing.  Abdominal:     Palpations: Abdomen is soft. There is no mass.     Tenderness: There is no abdominal tenderness. There is no guarding.     Comments: Fundus U/2  Genitourinary:    Comments: Swelling of right labia and perineum, no vaginal bleeding with fundal rub, laceration approximated and stitches in place Skin:    General: Skin is warm and dry.  Neurological:     Mental Status: She is alert and oriented to person, place, and time.  Psychiatric:        Mood and Affect: Mood normal.        Behavior: Behavior normal.        Thought Content: Thought content normal.     A Medical screening exam complete 1. Physically well but worried   2. Postpartum care and examination     P Discharge from MAU in stable condition Educated and discussed use of witch hazel padsicles to help reduce swelling  Warning signs for worsening condition that would warrant emergency follow-up discussed Patient may return to MAU as needed   , CNM 12/23/2019 2:14 AM

## 2019-12-23 NOTE — Discharge Instructions (Signed)

## 2020-01-27 ENCOUNTER — Ambulatory Visit: Payer: Medicaid Other | Admitting: Obstetrics and Gynecology

## 2020-02-24 ENCOUNTER — Ambulatory Visit (INDEPENDENT_AMBULATORY_CARE_PROVIDER_SITE_OTHER): Payer: Medicaid Other | Admitting: Advanced Practice Midwife

## 2020-02-24 ENCOUNTER — Other Ambulatory Visit: Payer: Self-pay

## 2020-02-24 ENCOUNTER — Encounter: Payer: Self-pay | Admitting: Advanced Practice Midwife

## 2020-02-24 VITALS — BP 106/65 | HR 77 | Ht 62.0 in | Wt 149.5 lb

## 2020-02-24 DIAGNOSIS — Z975 Presence of (intrauterine) contraceptive device: Secondary | ICD-10-CM | POA: Diagnosis not present

## 2020-02-24 DIAGNOSIS — Z3202 Encounter for pregnancy test, result negative: Secondary | ICD-10-CM | POA: Diagnosis not present

## 2020-02-24 LAB — POCT PREGNANCY, URINE: Preg Test, Ur: NEGATIVE

## 2020-02-24 NOTE — Progress Notes (Signed)
    Post Partum Visit Note  Cynthia Osborne is a 20 y.o. G34P1001 female who presents for a postpartum visit. She is 9 weeks postpartum following a normal spontaneous vaginal delivery.  I have fully reviewed the prenatal and intrapartum course. The delivery was at 39 gestational weeks.  Anesthesia: epidural. Postpartum course has been uncomplicated. Baby is doing well and sleeping up to six hours at a time.  Baby is feeding by bottle - Carnation Good Start. Patient endorses breastfeeding for about Bleeding thin lochia. Bowel function is normal. Bladder function is normal. Patient is sexually active. Contraception method is IUD. Postpartum depression screening: negative.   The pregnancy intention screening data noted above was reviewed. Patient had a Liletta IUD placed postplacentally. She endorses being told by her partner he could feel the strings approximately one month after placement but has not been able to feel the strings during subsequent sexual encounters. She is concerned that her IUD may have dislodged.   The following portions of the patient's history were reviewed and updated as appropriate: allergies, current medications, past family history, past medical history, past social history, past surgical history and problem list.  Review of Systems A comprehensive review of systems was negative.    Objective:  There were no vitals taken for this visit.   General:  alert, cooperative, appears stated age and no distress   Breasts:  negative  Lungs: clear to auscultation bilaterally  Heart:  normal apical impulse  Abdomen: soft, non-tender; bowel sounds normal; no masses,  no organomegaly   Vulva:  normal  Vagina: normal vagina, no discharge, exudate, lesion, or erythema  Cervix:  no cervical motion tenderness and no lesions. IUD strings visible, no protrusion of IUD body. Strings appropriate length        Assessment:    Normal postpartum exam. Pap smear not at today's visit.    Plan:   Essential components of care per ACOG recommendations:  1.  Mood and well being: Patient with negative depression screening today. Reviewed local resources for support.  - Patient does not use tobacco.  - hx of drug use? No    2. Infant care and feeding:  -Patient currently breastmilk feeding? No   3. Sexuality, contraception and birth spacing   4. Sleep and fatigue -Encouraged family/partner/community support of 4 hrs of uninterrupted sleep to help with mood and fatigue  5. Physical Recovery  - Discussed patients delivery and complications - Patient has urinary incontinence? No - Patient is safe to resume physical and sexual activity  6.  Health Maintenance - Turns 20 10/26/2020, will be due for first pap  --Negative urine pregnancy test during visit. Patient endorses history of negative UPTs in previous pregnancy, requests Quant hCG --Quant hCG resulted overnight, also negative. Patient notified via active MyChart account.  Return to clinic after 10/2020 for well woman with pap smear  Clayton Bibles, MSN, CNM Certified Nurse Midwife, [facility] for [facility], [facility] 02/25/20 8:17 AM

## 2020-02-25 LAB — BETA HCG QUANT (REF LAB): hCG Quant: <1 m[IU]/mL

## 2020-02-25 NOTE — Patient Instructions (Signed)
Preventive Care 21-21 Years Old, Female Preventive care refers to lifestyle choices and visits with your health care provider that can promote health and wellness. This includes:  A yearly physical exam. This is also called an annual wellness visit.  Regular dental and eye exams.  Immunizations.  Screening for certain conditions.  Healthy lifestyle choices, such as: ? Eating a healthy diet. ? Getting regular exercise. ? Not using drugs or products that contain nicotine and tobacco. ? Limiting alcohol use. What can I expect for my preventive care visit? Physical exam Your health care provider may check your:  Height and weight. These may be used to calculate your BMI (body mass index). BMI is a measurement that tells if you are at a healthy weight.  Heart rate and blood pressure.  Body temperature.  Skin for abnormal spots. Counseling Your health care provider may ask you questions about your:  Past medical problems.  Family's medical history.  Alcohol, tobacco, and drug use.  Emotional well-being.  Home life and relationship well-being.  Sexual activity.  Diet, exercise, and sleep habits.  Work and work environment.  Access to firearms.  Method of birth control.  Menstrual cycle.  Pregnancy history. What immunizations do I need? Vaccines are usually given at various ages, according to a schedule. Your health care provider will recommend vaccines for you based on your age, medical history, and lifestyle or other factors, such as travel or where you work.   What tests do I need? Blood tests  Lipid and cholesterol levels. These may be checked every 5 years starting at age 20.  Hepatitis C test.  Hepatitis B test. Screening  Diabetes screening. This is done by checking your blood sugar (glucose) after you have not eaten for a while (fasting).  STD (sexually transmitted disease) testing, if you are at risk.  BRCA-related cancer screening. This may be  done if you have a family history of breast, ovarian, tubal, or peritoneal cancers.  Pelvic exam and Pap test. This may be done every 3 years starting at age 21. Starting at age 30, this may be done every 5 years if you have a Pap test in combination with an HPV test. Talk with your health care provider about your test results, treatment options, and if necessary, the need for more tests.   Follow these instructions at home: Eating and drinking  Eat a healthy diet that includes fresh fruits and vegetables, whole grains, lean protein, and low-fat dairy products.  Take vitamin and mineral supplements as recommended by your health care provider.  Do not drink alcohol if: ? Your health care provider tells you not to drink. ? You are pregnant, may be pregnant, or are planning to become pregnant.  If you drink alcohol: ? Limit how much you have to 0-1 drink a day. ? Be aware of how much alcohol is in your drink. In the [place] one drink equals one 12 oz bottle of beer (355 mL), one 5 oz glass of wine (148 mL), or one 1 oz glass of hard liquor (44 mL).   Lifestyle  Take daily care of your teeth and gums. Brush your teeth every morning and night with fluoride toothpaste. Floss one time each day.  Stay active. Exercise for at least 30 minutes 5 or more days each week.  Do not use any products that contain nicotine or tobacco, such as cigarettes, e-cigarettes, and chewing tobacco. If you need help quitting, ask your health care provider.  Do not   use drugs.  If you are sexually active, practice safe sex. Use a condom or other form of protection to prevent STIs (sexually transmitted infections).  If you do not wish to become pregnant, use a form of birth control. If you plan to become pregnant, see your health care provider for a prepregnancy visit.  Find healthy ways to cope with stress, such as: ? Meditation, yoga, or listening to music. ? Journaling. ? Talking to a trusted  person. ? Spending time with friends and family. Safety  Always wear your seat belt while driving or riding in a vehicle.  Do not drive: ? If you have been drinking alcohol. Do not ride with someone who has been drinking. ? When you are tired or distracted. ? While texting.  Wear a helmet and other protective equipment during sports activities.  If you have firearms in your house, make sure you follow all gun safety procedures.  Seek help if you have been physically or sexually abused. What's next?  Go to your health care provider once a year for an annual wellness visit.  Ask your health care provider how often you should have your eyes and teeth checked.  Stay up to date on all vaccines. This information is not intended to replace advice given to you by your health care provider. Make sure you discuss any questions you have with your health care provider. Document Revised: 09/28/2019 Document Reviewed: 10/11/2017 Elsevier Patient Education  2021 Elsevier Inc.  

## 2020-02-26 ENCOUNTER — Telehealth: Payer: Self-pay

## 2020-02-26 MED ORDER — METRONIDAZOLE 500 MG PO TABS: 500.0000 mg | ORAL_TABLET | Freq: Two times a day (BID) | ORAL | 0 refills | Status: DC

## 2020-02-26 NOTE — Telephone Encounter (Signed)
Per Thalia Bloodgood, CNM pt has BV and needs to be treated with Flagyl 500 mg po bid x 7 days.  Left message for pt stateing that there is a prescription that has been sent to her [facility] pharmacy on [address].  If she could please pick up the medication and take as prescribed.  If she has any questions to please call the office or respond to MyChart message.  MycChart message sent.    Addison Naegeli, RN  02/26/20

## 2020-03-26 ENCOUNTER — Encounter: Payer: Self-pay | Admitting: Family Medicine

## 2020-03-26 ENCOUNTER — Other Ambulatory Visit (HOSPITAL_COMMUNITY)
Admission: RE | Admit: 2020-03-26 | Discharge: 2020-03-26 | Disposition: A | Discharge status: Home / Self Care | Payer: Medicaid Other | Source: Ambulatory Visit | Attending: Family Medicine | Admitting: Family Medicine

## 2020-03-26 ENCOUNTER — Other Ambulatory Visit: Payer: Self-pay

## 2020-03-26 ENCOUNTER — Ambulatory Visit (INDEPENDENT_AMBULATORY_CARE_PROVIDER_SITE_OTHER): Payer: Medicaid Other | Admitting: Family Medicine

## 2020-03-26 DIAGNOSIS — N898 Other specified noninflammatory disorders of vagina: Secondary | ICD-10-CM | POA: Diagnosis not present

## 2020-03-26 DIAGNOSIS — Z975 Presence of (intrauterine) contraceptive device: Secondary | ICD-10-CM | POA: Diagnosis not present

## 2020-03-26 LAB — POCT PREGNANCY, URINE: Preg Test, Ur: NEGATIVE

## 2020-03-26 NOTE — Assessment & Plan Note (Signed)
On exam areas of concern c/w bartholin's gland opening at 8 o clock as well as some mobile hymenal tissue that was extruding from the vagina with very little manipulation. Discussed that both of these findings were normal and especially hymenal tissue likely secondary to changes after delivery/2nd degree laceration. Reassured that there were no abnormal findings, self swab per patient request to look for infection.

## 2020-03-26 NOTE — Assessment & Plan Note (Signed)
IUD strings visualized just barely protruding from cervical os. Discussed with patient that this is the reason for partner discomfort and I could attempt to trim them further but this would make removal more difficult in the future. She requested further shortening I was able to trim just under 1 cm worth of strings, now visible only with manipulation of the os with a fox swab.

## 2020-03-26 NOTE — Progress Notes (Signed)
GYNECOLOGY OFFICE VISIT NOTE  History:   Cynthia Osborne is a 20 y.o. G1P1001 here today for IUD string check and possible vaginal infection.  Patient reports she is concerned a "piece of meat" is coming out of her vagina Worried there is abnormal healing from her 2nd degree perineal laceration  Also concerned there may be some abnormal discharge and itching Worried about an infection  Also having an issue with her IUD, feels a sharp point when she feels for the strings and her partner feels them during intercourse as well  Past Medical History:  Diagnosis Date  . Scoliosis     Past Surgical History:  Procedure Laterality Date  . NO PAST SURGERIES      The following portions of the patient's history were reviewed and updated as appropriate: allergies, current medications, past family history, past medical history, past social history, past surgical history and problem list.   Health Maintenance:  Normal pap and negative HRHPV: n/a.  Normal mammogram: n/a.   Review of Systems:  Pertinent items noted in HPI and remainder of comprehensive ROS otherwise negative.  Physical Exam:  BP 117/70   Pulse 79   Ht 5\' 2"  (1.575 m)   Wt 150 lb 11.2 oz (68.4 kg)   LMP  (LMP Unknown)   BMI 27.56 kg/m  CONSTITUTIONAL: Well-developed, well-nourished female in no acute distress.  HEENT:  Normocephalic, atraumatic. External right and left ear normal. No scleral icterus.  NECK: Normal range of motion, supple, no masses noted on observation SKIN: No rash noted. Not diaphoretic. No erythema. No pallor. MUSCULOSKELETAL: Normal range of motion. No edema noted. NEUROLOGIC: Alert and oriented to person, place, and time. Normal muscle tone coordination.  PSYCHIATRIC: Normal mood and affect. Normal behavior. Normal judgment and thought content. RESPIRATORY: Effort normal, no problems with respiration noted ABDOMEN: No masses noted. No other overt distention noted.   PELVIC: Normal  appearing external genitalia; two areas of concern consistent with bartholins gland opening and hymenal ring protrusion, normal appearing vaginal mucosa, retroverted and anterior position of cervix, normal appearing cervix with multiparous appearance.  No abnormal discharge noted.  IUD strings visualized just barely protruding from the cervical os..  Labs and Imaging No results found for this or any previous visit (from the past 168 hour(s)). No results found.    Assessment and Plan:   Problem List Items Addressed This Visit      Other   Vaginal delivery - Primary    On exam areas of concern c/w bartholin's gland opening at 8 o clock as well as some mobile hymenal tissue that was extruding from the vagina with very little manipulation. Discussed that both of these findings were normal and especially hymenal tissue likely secondary to changes after delivery/2nd degree laceration. Reassured that there were no abnormal findings, self swab per patient request to look for infection.       IUD (intrauterine device) in place    IUD strings visualized just barely protruding from cervical os. Discussed with patient that this is the reason for partner discomfort and I could attempt to trim them further but this would make removal more difficult in the future. She requested further shortening I was able to trim just under 1 cm worth of strings, now visible only with manipulation of the os with a fox swab.          Routine preventative health maintenance measures emphasized. Please refer to After Visit Summary for other counseling recommendations.   Return  in about 1 year (around 03/26/2021) for Annual Wellness Visit and pap smear.    Total face-to-face time with patient: 20 minutes.  Over 50% of encounter was spent on counseling and coordination of care.   Venora Maples, MD/[facility] for [employer], [facility]

## 2020-03-29 LAB — CERVICOVAGINAL ANCILLARY ONLY
Bacterial Vaginitis (gardnerella): NEGATIVE
Candida Glabrata: NEGATIVE
Candida Vaginitis: NEGATIVE
Chlamydia: NEGATIVE
Comment: NEGATIVE
Comment: NEGATIVE
Comment: NEGATIVE
Comment: NEGATIVE
Comment: NEGATIVE
Comment: NORMAL
Neisseria Gonorrhea: NEGATIVE
Trichomonas: NEGATIVE

## 2020-04-02 ENCOUNTER — Telehealth: Payer: Self-pay

## 2020-04-02 NOTE — Telephone Encounter (Signed)
Called pt in regards to her MyChart message and was unable to leave message due to voicemail box not set up yet x 2.  Will respond to her via MyChart.    Addison Naegeli, RN

## 2020-06-29 ENCOUNTER — Ambulatory Visit: Payer: Medicaid Other | Admitting: Family Medicine

## 2020-10-19 ENCOUNTER — Ambulatory Visit (INDEPENDENT_AMBULATORY_CARE_PROVIDER_SITE_OTHER): Payer: Medicaid Other | Admitting: Nurse Practitioner

## 2020-10-19 ENCOUNTER — Other Ambulatory Visit: Payer: Self-pay

## 2020-10-19 ENCOUNTER — Encounter: Payer: Self-pay | Admitting: Nurse Practitioner

## 2020-10-19 VITALS — BP 110/74 | HR 78 | Ht 62.0 in | Wt 168.0 lb

## 2020-10-19 DIAGNOSIS — Z30431 Encounter for routine checking of intrauterine contraceptive device: Secondary | ICD-10-CM | POA: Diagnosis not present

## 2020-10-19 NOTE — Progress Notes (Signed)
Patient complains of IUD "poking significant other" during intercourse. She stated that this is not her first visit in regards to this problem.

## 2020-10-19 NOTE — Progress Notes (Signed)
   GYNECOLOGY OFFICE VISIT NOTE   History:  20 y.o. G1P1001 here today for IUD check - states her partner is being stuck during intercourse with the IUD strings. She denies any abnormal vaginal discharge, bleeding, pelvic pain or other concerns.   Previous notes on IUD insertion and IUD checks reviewed.  IUD strings have been trimmed short and are in the cervical canal.  Client reports partner only has this pain when her legs are up during intercourse - likely stretching the internal tissues and the cervix may be more open at that time.  Past Medical History:  Diagnosis Date   Scoliosis     Past Surgical History:  Procedure Laterality Date   NO PAST SURGERIES      The following portions of the patient's history were reviewed and updated as appropriate: allergies, current medications, past family history, past medical history, past social history, past surgical history and problem list.   Health Maintenance:  Pap not yet due  Review of Systems:  Pertinent items noted in HPI and remainder of comprehensive ROS otherwise negative.  Objective:  Physical Exam BP 110/74   Pulse 78   Ht 5\' 2"  (1.575 m)   Wt 168 lb (76.2 kg)   LMP 09/26/2020 (Exact Date)   BMI 30.73 kg/m  CONSTITUTIONAL: Well-developed, well-nourished female in no acute distress.  HENT:  Normocephalic, atraumatic. External right and left ear normal.  EYES: Conjunctivae and EOM are normal. Pupils are equal, round.  No scleral icterus.  NECK: Normal range of motion, supple, no masses SKIN: Skin is warm and dry. No rash noted. Not diaphoretic. No erythema. No pallor. NEUROLOGIC: Alert and oriented to person, place, and time. Normal muscle tone coordination. No cranial nerve deficit noted. PSYCHIATRIC: Normal mood and affect. Normal behavior. Normal judgment and thought content. CARDIOVASCULAR: Normal heart rate noted RESPIRATORY: Effort and breath sounds normal, no problems with respiration noted ABDOMEN: Soft, no  distention noted.   PELVIC:  Normal vaginal mucosa, no discharge, IUD string identified within cervical canal to the left side using a swab. MUSCULOSKELETAL: Normal range of motion. No edema noted.  Labs and Imaging No results found.  Assessment & Plan:  IUD check up  Plan Unable to trim IUD strings further. Reassured about placement of her IUD. Reviewed options for intercourse to avoid straight on penetration. Will be due for pap at next visit.  Routine preventative health maintenance measures emphasized. Please refer to After Visit Summary for other counseling recommendations.   Total face-to-face time with patient:  10  minutes.  Over 50% of encounter was spent on counseling and coordination of care.  09/28/2020, RN, MSN, NP-BC Nurse Practitioner, [facility] for [facility] 10/19/2020 4:00 PM

## 2020-12-24 ENCOUNTER — Other Ambulatory Visit (HOSPITAL_COMMUNITY)
Admission: RE | Admit: 2020-12-24 | Discharge: 2020-12-24 | Disposition: A | Discharge status: Home / Self Care | Payer: Medicaid Other | Source: Ambulatory Visit | Attending: Obstetrics & Gynecology | Admitting: Obstetrics & Gynecology

## 2020-12-24 ENCOUNTER — Ambulatory Visit (INDEPENDENT_AMBULATORY_CARE_PROVIDER_SITE_OTHER): Payer: Medicaid Other | Admitting: Obstetrics & Gynecology

## 2020-12-24 ENCOUNTER — Other Ambulatory Visit: Payer: Self-pay

## 2020-12-24 ENCOUNTER — Encounter: Payer: Self-pay | Admitting: Obstetrics & Gynecology

## 2020-12-24 VITALS — BP 116/69 | HR 84 | Ht 62.0 in | Wt 175.3 lb

## 2020-12-24 DIAGNOSIS — Z3202 Encounter for pregnancy test, result negative: Secondary | ICD-10-CM | POA: Diagnosis not present

## 2020-12-24 DIAGNOSIS — N898 Other specified noninflammatory disorders of vagina: Secondary | ICD-10-CM

## 2020-12-24 DIAGNOSIS — Z975 Presence of (intrauterine) contraceptive device: Secondary | ICD-10-CM

## 2020-12-24 LAB — POCT PREGNANCY, URINE: Preg Test, Ur: NEGATIVE

## 2020-12-24 NOTE — Progress Notes (Signed)
Pt states only had slight spotting when she was to start cycle, wanted UPT.

## 2020-12-24 NOTE — Addendum Note (Signed)
Addended by: Henrietta Dine on: 12/24/2020 12:13 PM   Modules accepted: Orders

## 2020-12-24 NOTE — Progress Notes (Signed)
Patient ID: Cynthia Osborne, female   DOB: 2000/11/14, 20 y.o.   MRN: [id number]  Chief Complaint  Patient presents with   Gynecologic Exam    HPI Katieann Hungate is a 20 y.o. female.  G1P1001 Patient's last menstrual period was 12/17/2020. For 2 days she has had a vaginal discharge with itching and a vaginal wall cyst. HPI  Past Medical History:  Diagnosis Date   Scoliosis     Past Surgical History:  Procedure Laterality Date   NO PAST SURGERIES      History reviewed. No pertinent family history.  Social History Social History   Tobacco Use   Smoking status: Never   Smokeless tobacco: Never  Substance Use Topics   Alcohol use: Never   Drug use: Never    No Known Allergies  Current Outpatient Medications  Medication Sig Dispense Refill   acetaminophen (TYLENOL) 325 MG tablet Take 2 tablets (650 mg total) by mouth every 4 (four) hours as needed (for pain scale < 4). (Patient not taking: Reported on 10/19/2020) 30 tablet 0   ibuprofen (ADVIL) 600 MG tablet Take 1 tablet (600 mg total) by mouth every 6 (six) hours. (Patient not taking: Reported on 10/19/2020) 30 tablet 0   No current facility-administered medications for this visit.    Review of Systems Review of Systems  Constitutional: Negative.   Respiratory: Negative.    Cardiovascular: Negative.   Gastrointestinal: Negative.   Genitourinary:  Positive for vaginal bleeding (irregular dark spotting), vaginal discharge and vaginal pain (at vaginal cyst). Negative for menstrual problem and pelvic pain.   Blood pressure 116/69, pulse 84, height 5\' 2"  (1.575 m), weight 175 lb 4.8 oz (79.5 kg), last menstrual period 12/17/2020, not currently breastfeeding.  Physical Exam Physical Exam Vitals and nursing note reviewed. Exam conducted with a chaperone present.  Constitutional:      Appearance: Normal appearance.  Cardiovascular:     Rate and Rhythm: Normal rate.  Pulmonary:     Effort: Pulmonary effort  is normal.  Abdominal:     General: Abdomen is flat.  Genitourinary:    Exam position: Lithotomy position.     Vagina: Normal.     Cervix: Discharge present.     Uterus: Normal.      Adnexa: Right adnexa normal and left adnexa normal.          Comments: Inclusion cyst 1.5 cm at hymen on right, skin is translucent over the cyst, not tender. No suspicion of abscess  String at os Skin:    General: Skin is warm and dry.  Neurological:     Mental Status: She is alert.  Psychiatric:        Mood and Affect: Mood normal.        Behavior: Behavior normal.    Data Reviewed Needs pap  Assessment Vaginal inclusion cyst  IUD (intrauterine device) in place   Plan Report if the cyst enlarges or is more painful.  F/u on today's labs in Mychart. RTC for annual and pap    13/05/2020 12/24/2020, 9:44 AM

## 2020-12-27 LAB — CERVICOVAGINAL ANCILLARY ONLY
Bacterial Vaginitis (gardnerella): NEGATIVE
Candida Glabrata: NEGATIVE
Candida Vaginitis: POSITIVE — AB
Chlamydia: NEGATIVE
Comment: NEGATIVE
Comment: NEGATIVE
Comment: NEGATIVE
Comment: NEGATIVE
Comment: NEGATIVE
Comment: NORMAL
Neisseria Gonorrhea: NEGATIVE
Trichomonas: NEGATIVE

## 2020-12-28 MED ORDER — FLUCONAZOLE 150 MG PO TABS: 150.0000 mg | ORAL_TABLET | Freq: Once | ORAL | 0 refills | Status: AC

## 2020-12-28 NOTE — Addendum Note (Signed)
Addended by: Adam Phenix on: 12/28/2020 03:23 PM   Modules accepted: Orders

## 2021-07-21 ENCOUNTER — Other Ambulatory Visit (HOSPITAL_COMMUNITY)
Admission: RE | Admit: 2021-07-21 | Discharge: 2021-07-21 | Disposition: A | Discharge status: Home / Self Care | Payer: Medicaid Other | Source: Ambulatory Visit | Attending: Medical | Admitting: Medical

## 2021-07-21 ENCOUNTER — Encounter: Payer: Self-pay | Admitting: Family Medicine

## 2021-07-21 ENCOUNTER — Ambulatory Visit (INDEPENDENT_AMBULATORY_CARE_PROVIDER_SITE_OTHER): Payer: Medicaid Other | Admitting: Medical

## 2021-07-21 ENCOUNTER — Encounter: Payer: Self-pay | Admitting: Medical

## 2021-07-21 VITALS — BP 115/83 | HR 77 | Ht 63.0 in | Wt 185.3 lb

## 2021-07-21 DIAGNOSIS — Z3202 Encounter for pregnancy test, result negative: Secondary | ICD-10-CM

## 2021-07-21 DIAGNOSIS — Z01419 Encounter for gynecological examination (general) (routine) without abnormal findings: Secondary | ICD-10-CM | POA: Diagnosis present

## 2021-07-21 DIAGNOSIS — Z30432 Encounter for removal of intrauterine contraceptive device: Secondary | ICD-10-CM | POA: Diagnosis not present

## 2021-07-21 DIAGNOSIS — N912 Amenorrhea, unspecified: Secondary | ICD-10-CM | POA: Diagnosis not present

## 2021-07-21 LAB — POCT PREGNANCY, URINE: Preg Test, Ur: NEGATIVE

## 2021-07-21 NOTE — Progress Notes (Signed)
History:  Ms. Cynthia Osborne is a 21 y.o. G1P1001 who presents to clinic today for annual exam and IUD removal. The patient states that she had been having regular periods and then missed her last period. She would like UPT and hCG today since last pregnancy was not visible on UPT alone. She denies abnormal discharge and declines STD testing today. She is due for her first pap smear today. She denies pain, UTI symptoms or fever. She is sexually active with one female partner.    The following portions of the patient's history were reviewed and updated as appropriate: allergies, current medications, family history, past medical history, social history, past surgical history and problem list.  Review of Systems:  Review of Systems  Constitutional:  Negative for fever and malaise/fatigue.  Gastrointestinal:  Negative for abdominal pain.  Genitourinary:  Negative for dysuria, frequency and urgency.       Neg - vaginal bleeding, discharge, pelvic pain      Objective:  Physical Exam BP 115/83   Pulse 77   Ht 5\' 3"  (1.6 m)   Wt 185 lb 4.8 oz (84.1 kg)   BMI 32.82 kg/m  Physical Exam Vitals and nursing note reviewed. Exam conducted with a chaperone present.  Constitutional:      General: She is not in acute distress.    Appearance: Normal appearance. She is well-developed and normal weight.  HENT:     Head: Normocephalic and atraumatic.  Neck:     Thyroid: No thyromegaly.  Cardiovascular:     Rate and Rhythm: Normal rate and regular rhythm.     Heart sounds: No murmur heard. Pulmonary:     Effort: Pulmonary effort is normal. No respiratory distress.     Breath sounds: Normal breath sounds. No wheezing.  Abdominal:     General: Abdomen is flat. Bowel sounds are normal. There is no distension.     Palpations: Abdomen is soft. There is no mass.     Tenderness: There is no abdominal tenderness. There is no guarding or rebound.  Genitourinary:    General: Normal vulva.      Vagina: Vaginal discharge (small, white) present. No erythema, tenderness or bleeding.     Cervix: Cervical bleeding (small following pap smear) present. No cervical motion tenderness, discharge, friability, lesion or erythema.  Musculoskeletal:     Cervical back: Neck supple.  Skin:    General: Skin is warm and dry.     Findings: No erythema.  Neurological:     Mental Status: She is alert and oriented to person, place, and time.  Psychiatric:        Mood and Affect: Mood normal.     Labs and Imaging UPT - negative today   Health Maintenance Due  Topic Date Due   COVID-19 Vaccine (1) Never done   HPV VACCINES (1 - 2-dose series) Never done   PAP-Cervical Cytology Screening  Never done   PAP SMEAR-Modifier  Never done    GYNECOLOGY CLINIC PROCEDURE NOTE  IUD Removal  Patient was in the dorsal lithotomy position, normal external genitalia was noted.  A speculum was placed in the patient's vagina, normal discharge was noted, no lesions. The multiparous cervix was visualized, no lesions, no abnormal discharge.  The strings of the IUD were grasped and pulled using ring forceps. The IUD was removed in its entirety.  Patient tolerated the procedure well.    Routine preventative health maintenance measures emphasized.   Assessment & Plan:  1. Amenorrhea -  UPT negative today  - Beta hCG quant (ref lab)  2. Encounter for annual routine gynecological examination - Cytology - PAP( [facility])  Return to [facility] in 1 year for annual exam or sooner PRN   Marny Lowenstein, PA-C 07/21/2021 11:01 AM

## 2021-07-22 LAB — CYTOLOGY - PAP: Diagnosis: NEGATIVE

## 2021-07-22 LAB — BETA HCG QUANT (REF LAB): hCG Quant: <1 m[IU]/mL

## 2022-07-07 ENCOUNTER — Ambulatory Visit: Payer: Medicaid Other | Admitting: Obstetrics and Gynecology

## 2023-04-09 ENCOUNTER — Ambulatory Visit: Payer: Medicaid Other | Admitting: Obstetrics and Gynecology
